# Patient Record
Sex: Male | Born: 1990 | Race: White | Hispanic: No | Marital: Single | Smoking: Never smoker
Health system: Southern US, Community
[De-identification: ages and names within clinical notes are randomized; demographics above are authoritative.]

---

## 2016-11-08 ENCOUNTER — Emergency Department (HOSPITAL_COMMUNITY): Payer: Managed Care, Other (non HMO)

## 2016-11-08 ENCOUNTER — Emergency Department (HOSPITAL_COMMUNITY)
Admission: EM | Admit: 2016-11-08 | Discharge: 2016-11-09 | Disposition: A | Discharge status: Home / Self Care | Payer: Managed Care, Other (non HMO) | Attending: Emergency Medicine | Admitting: Emergency Medicine

## 2016-11-08 ENCOUNTER — Encounter (HOSPITAL_COMMUNITY): Payer: Self-pay | Admitting: *Deleted

## 2016-11-08 DIAGNOSIS — Y9366 Activity, soccer: Secondary | ICD-10-CM | POA: Diagnosis not present

## 2016-11-08 DIAGNOSIS — Y999 Unspecified external cause status: Secondary | ICD-10-CM | POA: Insufficient documentation

## 2016-11-08 DIAGNOSIS — S0990XA Unspecified injury of head, initial encounter: Secondary | ICD-10-CM | POA: Diagnosis present

## 2016-11-08 DIAGNOSIS — Y92322 Soccer field as the place of occurrence of the external cause: Secondary | ICD-10-CM | POA: Diagnosis not present

## 2016-11-08 DIAGNOSIS — S060X1A Concussion with loss of consciousness of 30 minutes or less, initial encounter: Secondary | ICD-10-CM

## 2016-11-08 DIAGNOSIS — Z23 Encounter for immunization: Secondary | ICD-10-CM | POA: Diagnosis not present

## 2016-11-08 DIAGNOSIS — S0003XA Contusion of scalp, initial encounter: Secondary | ICD-10-CM

## 2016-11-08 DIAGNOSIS — W2201XA Walked into wall, initial encounter: Secondary | ICD-10-CM | POA: Diagnosis not present

## 2016-11-08 MED ORDER — IBUPROFEN 800 MG PO TABS: 800.0000 mg | ORAL_TABLET | Freq: Three times a day (TID) | ORAL | 0 refills | Status: AC

## 2016-11-08 MED ORDER — TETANUS-DIPHTH-ACELL PERTUSSIS 5-2.5-18.5 LF-MCG/0.5 IM SUSP
0.5000 mL | Freq: Once | INTRAMUSCULAR | Status: AC
  Administered 2016-11-09: 0.5 mL via INTRAMUSCULAR
  Filled 2016-11-08: qty 0.5

## 2016-11-08 NOTE — ED Triage Notes (Signed)
Pt fell backwards and struck his head on the wall.  Pt had a brief syncope.  Pt states that he feels a little concussed.  Pt also reports soreness in his right shoulder.  Pt is alert and oriented.  Pt reports some blurred vision right after this occurred but feels better now.  Nausea has subsided since he ate.

## 2016-11-08 NOTE — ED Provider Notes (Signed)
[facility]-EMERGENCY DEPT Provider Note   CSN: [id number] Arrival date & time: 11/08/16  2235     History   Chief Complaint Chief Complaint  Patient presents with  . Head Injury    HPI Greg Phelps is a 26 y.o. male.  HPI   26 year old male presenting for evaluation of head injury. Patient report he was playing indoor soccer when he fell backward and struck head against a brick wall and had a brief several second LOC that was witness.  He did report feeling a bit confused, blurry vision and mild nausea after the impact.  He went out and ate dinner but when he noticed blurred vision pt decided to come to the ER.  Aside from mild 3/10 sharp pain to posterior scalp and R shoulder, pt denies light/sound sensitivity, increase confusion, focal numbness or weakness.  No specific treatment tried.  Pt works in [employer].  Not on any blood thinner, unable to recall last tetanus status.    History reviewed. No pertinent past medical history.  There are no active problems to display for this patient.   History reviewed. No pertinent surgical history.     Home Medications    Prior to Admission medications   Medication Sig Start Date End Date Taking? Authorizing Provider  albuterol (PROVENTIL HFA;VENTOLIN HFA) 108 (90 Base) MCG/ACT inhaler Inhale 2 puffs into the lungs every 4 (four) hours as needed for wheezing or shortness of breath.  10/02/16 10/02/17 Yes Historical Provider, MD    Family History No family history on file.  Social History Social History  Substance Use Topics  . Smoking status: Never Smoker  . Smokeless tobacco: Never Used  . Alcohol use No     Allergies   Patient has no known allergies.   Review of Systems Review of Systems  All other systems reviewed and are negative.    Physical Exam Updated Vital Signs BP 138/94 (BP Location: Right Arm)   Pulse 90   Temp 98.2 F (36.8 C) (Oral)   Resp 18   SpO2 100%   Physical Exam  Constitutional: He is oriented to  person, place, and time. He appears well-developed and well-nourished. No distress.  HENT:  Scalp: 3cm ecchymosis noted to vertex of scalp, tender but no crepitus or laceration.    No hemotympanum, no battle sign or raccoon's eyes.  Eyes: Conjunctivae are normal.  Neck: Normal range of motion. Neck supple.   No significant midline spine tenderness, crepitus or step off  Cardiovascular: Normal rate and regular rhythm.   Musculoskeletal:  Bilateral shoulder with FROM.  Mild tenderness to posterior R shoulder.    Neurological: He is alert and oriented to person, place, and time. He has normal strength. No cranial nerve deficit or sensory deficit. He displays a negative Romberg sign. Coordination and gait normal. GCS eye subscore is 4. GCS verbal subscore is 5. GCS motor subscore is 6.  Skin: No rash noted.  Psychiatric: He has a normal mood and affect.  Nursing note and vitals reviewed.    ED Treatments / Results  Labs (all labs ordered are listed, but only abnormal results are displayed) Labs Reviewed - No data to display  EKG  EKG Interpretation None       Radiology Ct Head Wo Contrast  Result Date: 11/08/2016 CLINICAL DATA:  Status post fall backwards. Hit head on wall, with syncope. Blurred vision. Initial encounter. EXAM: CT HEAD WITHOUT CONTRAST TECHNIQUE: Contiguous axial images were obtained from the base of the skull  through the vertex without intravenous contrast. COMPARISON:  None. FINDINGS: Brain: No evidence of acute infarction, hemorrhage, hydrocephalus, extra-axial collection or mass lesion/mass effect. The posterior fossa, including the cerebellum, brainstem and fourth ventricle, is within normal limits. The third and lateral ventricles, and basal ganglia are unremarkable in appearance. The cerebral hemispheres are symmetric in appearance, with normal gray-white differentiation. No mass effect or midline shift is seen. Vascular: No hyperdense vessel or unexpected  calcification. Skull: There is no evidence of fracture; visualized osseous structures are unremarkable in appearance. Sinuses/Orbits: The visualized portions of the orbits are within normal limits. The paranasal sinuses and mastoid air cells are well-aerated. Other: Soft tissue swelling is noted at the posterior vertex. IMPRESSION: 1. No evidence of traumatic intracranial injury or fracture. 2. Soft tissue swelling at the posterior vertex. Electronically Signed   By: Roanna RaiderJeffery  Chang M.D.   On: 11/08/2016 23:04    Procedures Procedures (including critical care time)  Medications Ordered in ED Medications - No data to display   Initial Impression / Assessment and Plan / ED Course  I have reviewed the triage vital signs and the nursing notes.  Pertinent labs & imaging results that were available during my care of the patient were reviewed by me and considered in my medical decision making (see chart for details).     BP 138/94 (BP Location: Right Arm)   Pulse 90   Temp 98.2 F (36.8 C) (Oral)   Resp 18   SpO2 100%    Final Clinical Impressions(s) / ED Diagnoses   Final diagnoses:  Minor head injury, initial encounter  Concussion with loss of consciousness of 30 minutes or less, initial encounter  Contusion of scalp, initial encounter    New Prescriptions New Prescriptions   IBUPROFEN (ADVIL,MOTRIN) 800 MG TABLET    Take 1 tablet (800 mg total) by mouth 3 (three) times daily.   11:54 PM Pt here with head injury and concussive sxs.  Head CT scan without acute finding.  Pt is NVI.  Discussed care for concussion. Recommend rest, return precaution given. Will update tdap. Recommend avoid contact sport to prevent second impact syndrome.    Fayrene HelperBowie Mitchell Iwanicki, PA-C 11/08/16 2357    April Palumbo, MD 11/09/16 [phone]

## 2016-11-09 NOTE — ED Notes (Signed)
Pt declined dc vitals.

## 2018-03-19 IMAGING — CT CT HEAD W/O CM
3 of 4 series · 15 of 47 positions shown, 18 images · non-contrast
Comparison: None.

CLINICAL DATA: Status post fall backwards. Hit head on wall, with
syncope. Blurred vision. Initial encounter.

EXAM:
CT HEAD WITHOUT CONTRAST
TECHNIQUE: Contiguous axial images were obtained from the base of the skull
through the vertex without intravenous contrast.

[Series 2: head w/o · axial · non-contrast · 0.41mm/px · z∈[+1397,+1517]mm · 9 of 30 slices shown, 12 images]
[im 3/30  brain]
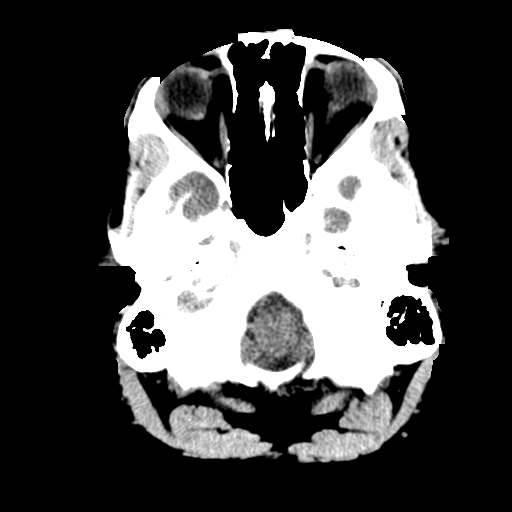
[im 3/30  bone]
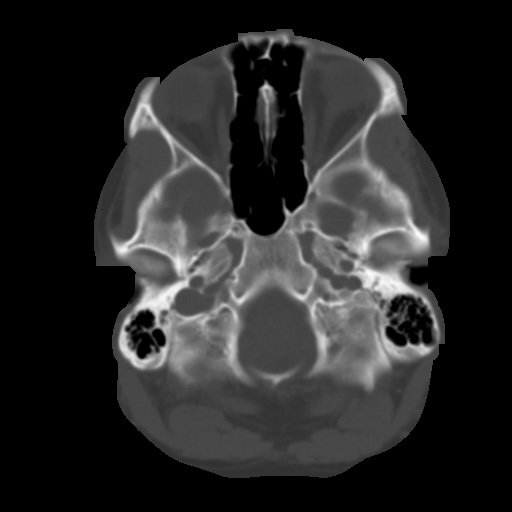
[im 6/30  brain]
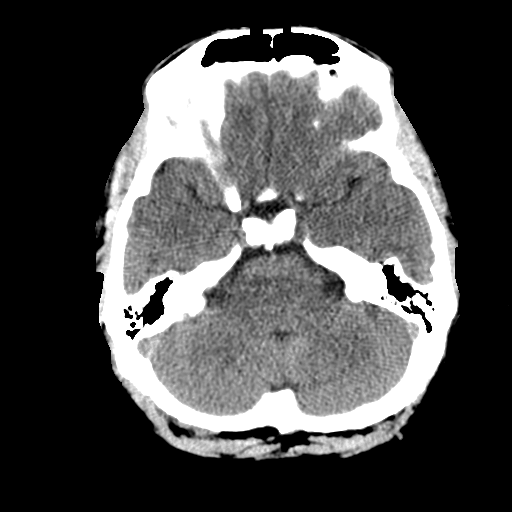
[im 9/30  brain]
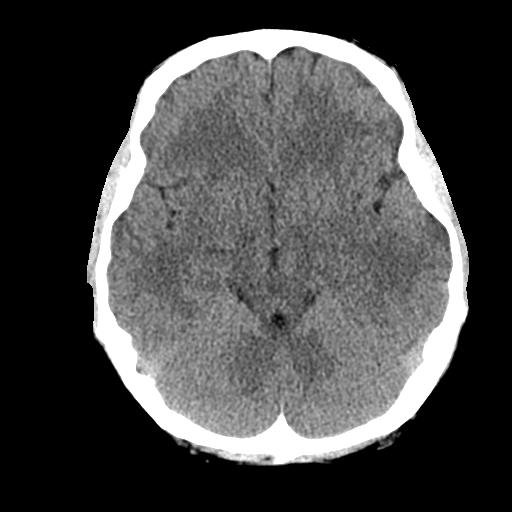
[im 12/30  brain]
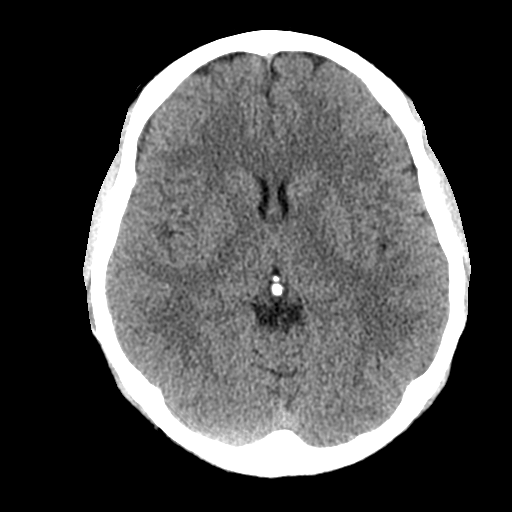
[im 15/30  brain]
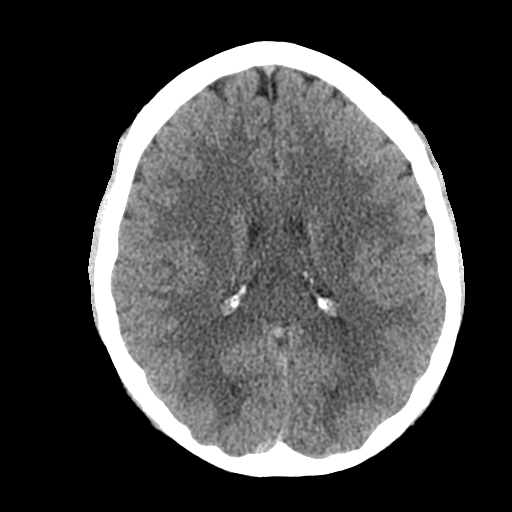
[im 15/30  bone]
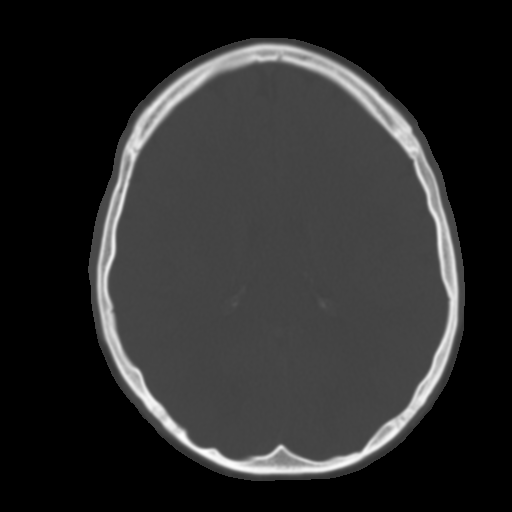
[im 18/30  brain]
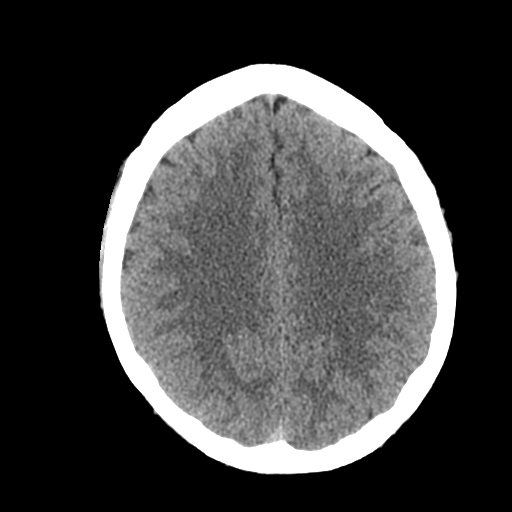
[im 21/30  brain]
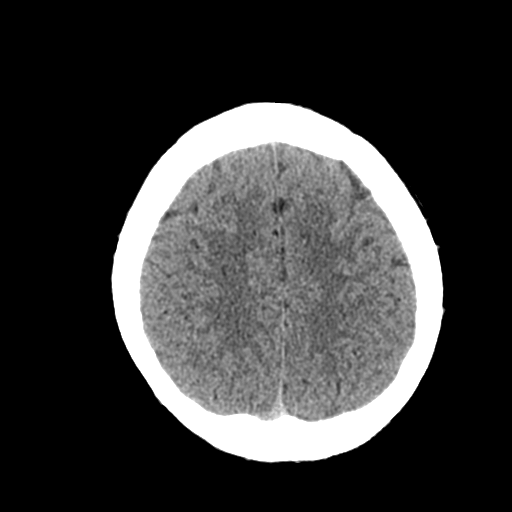
[im 24/30  brain]
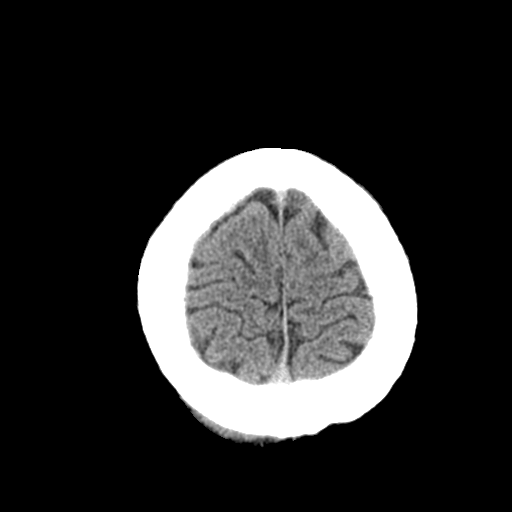
[im 27/30  brain]
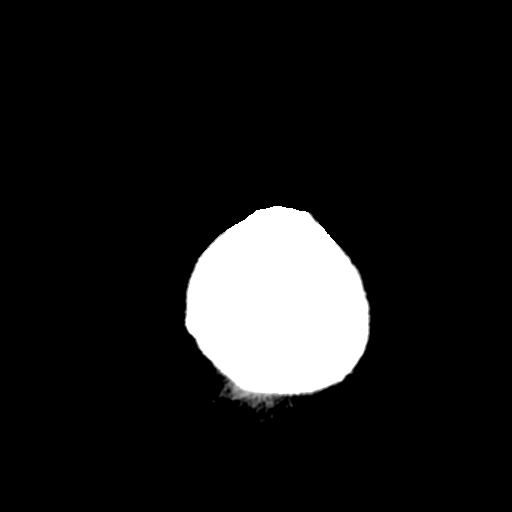
[im 27/30  bone]
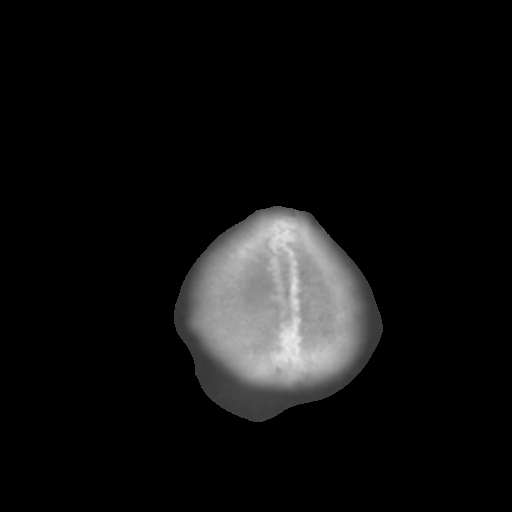

[Series 5: coronal · coronal · 0.29mm/px · 3 of 70 slices shown]
[im 24/70  brain]
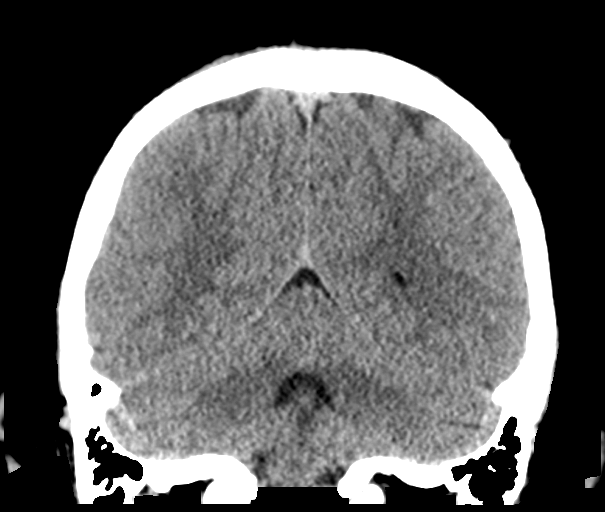
[im 31/70  brain]
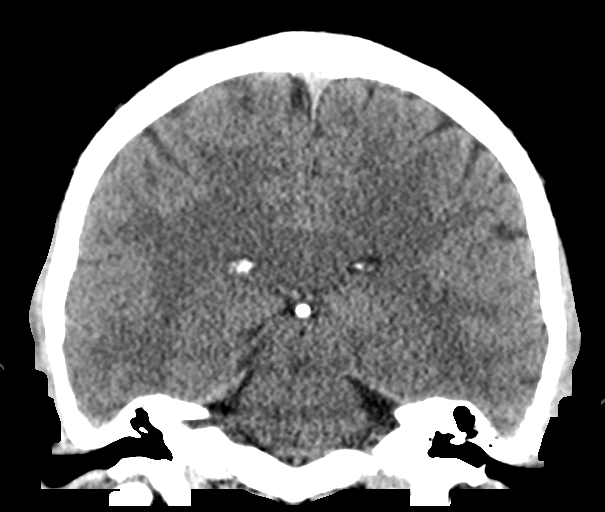
[im 39/70  brain]
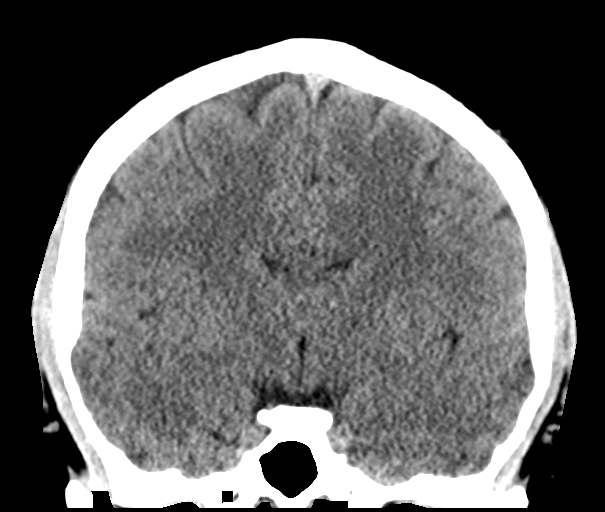

[Series 6: sagittal · sagittal · 0.31mm/px · 3 of 59 slices shown]
[im 20/59  brain]
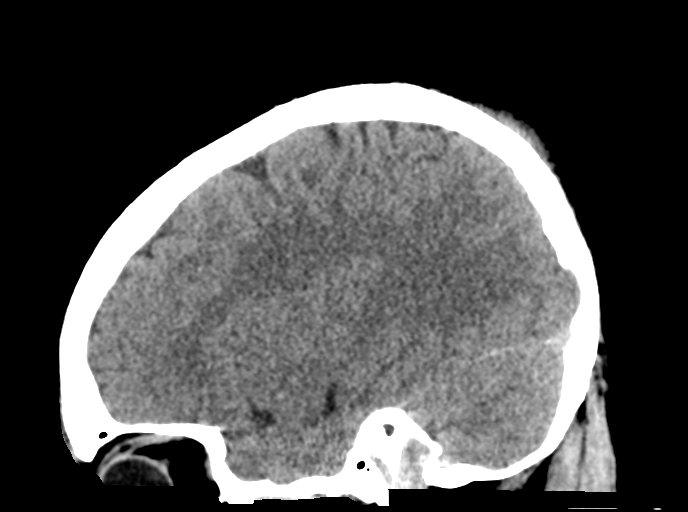
[im 30/59  brain]
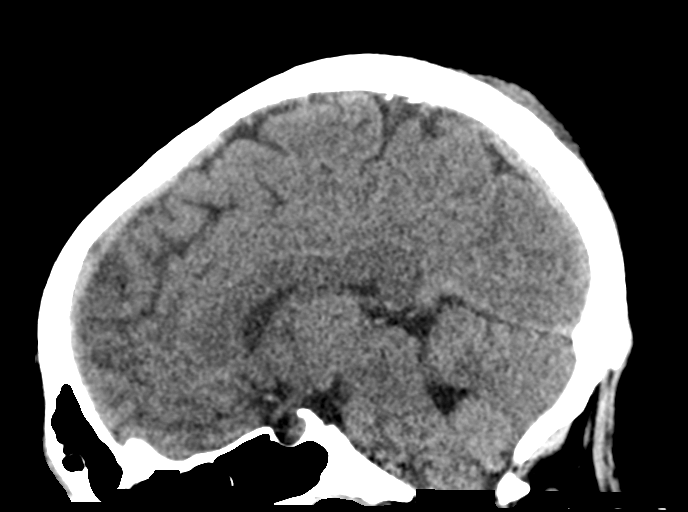
[im 39/59  brain]
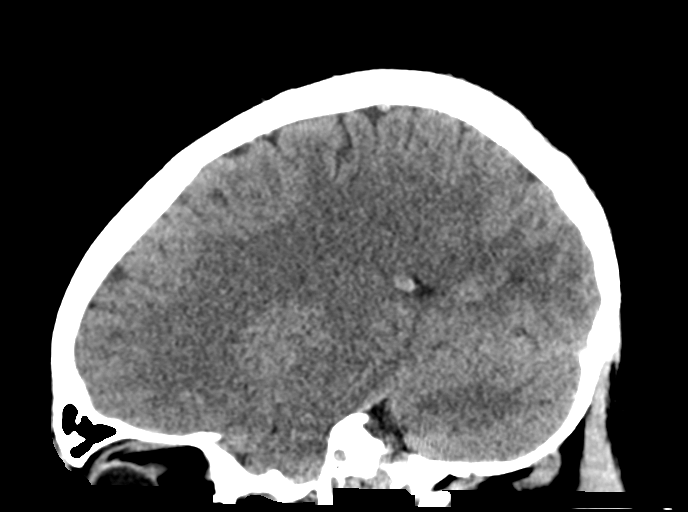

[15 of 47 positions shown; findings below may reference images not displayed]

FINDINGS: Brain: No evidence of acute infarction, hemorrhage, hydrocephalus,
extra-axial collection or mass lesion/mass effect.

The posterior fossa, including the cerebellum, brainstem and fourth
ventricle, is within normal limits. The third and lateral
ventricles, and basal ganglia are unremarkable in appearance. The
cerebral hemispheres are symmetric in appearance, with normal
gray-white differentiation. No mass effect or midline shift is seen.

Vascular: No hyperdense vessel or unexpected calcification.

Skull: There is no evidence of fracture; visualized osseous
structures are unremarkable in appearance.

Sinuses/Orbits: The visualized portions of the orbits are within
normal limits. The paranasal sinuses and mastoid air cells are
well-aerated.

Other: Soft tissue swelling is noted at the posterior vertex.
IMPRESSION: 1. No evidence of traumatic intracranial injury or fracture.
2. Soft tissue swelling at the posterior vertex.
